# Patient Record
Sex: Male | Born: 1985
Health system: Southern US, Community
[De-identification: ages and names within clinical notes are randomized; demographics above are authoritative.]

## PROBLEM LIST (undated history)

## (undated) DIAGNOSIS — M549 Dorsalgia, unspecified: Secondary | ICD-10-CM

## (undated) HISTORY — PX: OTHER SURGICAL HISTORY: SHX169

---
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
## 2016-12-23 ENCOUNTER — Emergency Department (HOSPITAL_COMMUNITY)
Admission: EM | Admit: 2016-12-23 | Discharge: 2016-12-23 | Disposition: A | Discharge status: Home / Self Care | Payer: 59 | Attending: Emergency Medicine | Admitting: Emergency Medicine

## 2016-12-23 ENCOUNTER — Encounter (HOSPITAL_COMMUNITY): Payer: Self-pay

## 2016-12-23 ENCOUNTER — Emergency Department (HOSPITAL_COMMUNITY): Payer: 59

## 2016-12-23 DIAGNOSIS — R519 Headache, unspecified: Secondary | ICD-10-CM

## 2016-12-23 DIAGNOSIS — F17210 Nicotine dependence, cigarettes, uncomplicated: Secondary | ICD-10-CM | POA: Insufficient documentation

## 2016-12-23 DIAGNOSIS — G43001 Migraine without aura, not intractable, with status migrainosus: Secondary | ICD-10-CM

## 2016-12-23 DIAGNOSIS — R51 Headache: Secondary | ICD-10-CM | POA: Diagnosis present

## 2016-12-23 DIAGNOSIS — G43909 Migraine, unspecified, not intractable, without status migrainosus: Secondary | ICD-10-CM | POA: Insufficient documentation

## 2016-12-23 HISTORY — DX: Dorsalgia, unspecified: M54.9

## 2016-12-23 LAB — CBC
HCT: 39.5 % (ref 39.0–52.0)
Hemoglobin: 13.9 g/dL (ref 13.0–17.0)
MCH: 32.9 pg (ref 26.0–34.0)
MCHC: 35.2 g/dL (ref 30.0–36.0)
MCV: 93.4 fL (ref 78.0–100.0)
Platelets: 277 K/uL (ref 150–400)
RBC: 4.23 MIL/uL (ref 4.22–5.81)
RDW: 12.5 % (ref 11.5–15.5)
WBC: 7.5 K/uL (ref 4.0–10.5)

## 2016-12-23 LAB — I-STAT CHEM 8, ED
BUN: 16 mg/dL (ref 6–20)
Calcium, Ion: 1.2 mmol/L (ref 1.15–1.40)
Chloride: 101 mmol/L (ref 101–111)
Creatinine, Ser: 0.8 mg/dL (ref 0.61–1.24)
Glucose, Bld: 92 mg/dL (ref 65–99)
HCT: 41 % (ref 39.0–52.0)
Hemoglobin: 13.9 g/dL (ref 13.0–17.0)
Potassium: 4.3 mmol/L (ref 3.5–5.1)
Sodium: 138 mmol/L (ref 135–145)
TCO2: 27 mmol/L (ref 0–100)

## 2016-12-23 LAB — CSF CELL COUNT WITH DIFFERENTIAL
RBC COUNT CSF: 0 /mm3
RBC Count, CSF: 0 /mm3
TUBE #: 4
Tube #: 1
WBC CSF: 2 /mm3 (ref 0–5)
WBC, CSF: 2 /mm3 (ref 0–5)

## 2016-12-23 LAB — PROTEIN, CSF: Total  Protein, CSF: 35 mg/dL (ref 15–45)

## 2016-12-23 LAB — GLUCOSE, CSF: GLUCOSE CSF: 65 mg/dL (ref 40–70)

## 2016-12-23 MED ORDER — LIDOCAINE-EPINEPHRINE (PF) 2 %-1:200000 IJ SOLN
10.0000 mL | Freq: Once | INTRAMUSCULAR | Status: AC
  Administered 2016-12-23: 10 mL
  Filled 2016-12-23: qty 20

## 2016-12-23 MED ORDER — IOPAMIDOL (ISOVUE-370) INJECTION 76%
100.0000 mL | Freq: Once | INTRAVENOUS | Status: AC | PRN
Start: 2016-12-23 — End: 2016-12-23
  Administered 2016-12-23: 100 mL via INTRAVENOUS

## 2016-12-23 MED ORDER — IOPAMIDOL (ISOVUE-370) INJECTION 76%
INTRAVENOUS | Status: AC
  Filled 2016-12-23: qty 100

## 2016-12-23 NOTE — Discharge Instructions (Signed)
We saw you in the ER for headaches. All the labs and imaging are normal. We are not sure what is causing your headaches, however, there appears to be no evidence of infection, bleeds or tumors based on our exam and results.  Please take motrin round the clock for the next 6 hours, and take other meds prescribed only for break through pain. See your doctor if the pain persists.

## 2016-12-23 NOTE — ED Provider Notes (Addendum)
[facility]EMERGENCY DEPT Provider Note   CSN: [id number] Arrival date & time: 12/23/16  1219     History   Chief Complaint Chief Complaint  Patient presents with  . Migraine  . Nausea    HPI Justin Barry is a 31 y.o. male.  HPI Pt comes in with cc of severe headaches. PT has no medical hx. His headaches have actually resolved, but he was seen by Neurologist who sent him to the ER for CT scan and LP for r/o SAH. Pt reports that 10 days aho he had sudden severe headaches that lasted for 4 days, improved and then came back again. Pain was thunder calp type headaches, with photophobia and there was no other neurologic symptoms associated with them. Pt does report that his headaches was worse everytime he strained.  Currently, pt has no headaches. I called the Neurologist who sent him to the ER, and they have closed for the weekend.  Past Medical History:  Diagnosis Date  . Back pain     There are no active problems to display for this patient.   Past Surgical History:  Procedure Laterality Date  . ear cyst removal         Home Medications    Prior to Admission medications   Not on File    Family History History reviewed. No pertinent family history.  Social History Social History  Substance Use Topics  . Smoking status: Current Some Day Smoker    Types: Cigarettes  . Smokeless tobacco: Never Used  . Alcohol use Yes     Comment: occasionally     Allergies   Patient has no known allergies.   Review of Systems Review of Systems  Constitutional: Negative for activity change and appetite change.  Respiratory: Negative for cough and shortness of breath.   Cardiovascular: Negative for chest pain.  Gastrointestinal: Negative for abdominal pain and nausea.  Neurological: Positive for headaches. Negative for dizziness, seizures, speech difficulty, light-headedness and numbness.     Physical Exam Updated Vital Signs BP 135/81 (BP Location: Left Arm)   Pulse  80   Temp 98.8 F (37.1 C) (Oral)   Resp 18   Ht 6' (1.829 m)   Wt 88.5 kg (195 lb)   SpO2 97%   BMI 26.45 kg/m   Physical Exam  Constitutional: He is oriented to person, place, and time. He appears well-developed.  HENT:  Head: Atraumatic.  Neck: Neck supple.  Cardiovascular: Normal rate.   Pulmonary/Chest: Effort normal.  Neurological: He is alert and oriented to person, place, and time. No cranial nerve deficit.  Skin: Skin is warm.  Nursing note and vitals reviewed.    ED Treatments / Results  Labs (all labs ordered are listed, but only abnormal results are displayed) Labs Reviewed  CSF CULTURE  CBC  GLUCOSE, CSF  PROTEIN, CSF  CSF CELL COUNT WITH DIFFERENTIAL  CSF CELL COUNT WITH DIFFERENTIAL  I-STAT CHEM 8, ED    EKG  EKG Interpretation None       Radiology Ct Angio Head W Or Wo Contrast  Result Date: 12/23/2016 CLINICAL DATA:  Severe headache beginning 2 weeks ago. EXAM: CT ANGIOGRAPHY HEAD TECHNIQUE: Multidetector CT imaging of the head was performed using the standard protocol during bolus administration of intravenous contrast. Multiplanar CT image reconstructions and MIPs were obtained to evaluate the vascular anatomy. CONTRAST:  100 cc Isovue 370 COMPARISON:  None. FINDINGS: CT HEAD Brain: No evidence of malformation, atrophy, old or acute small or large  vessel infarction, mass lesion, hemorrhage, hydrocephalus or extra-axial collection. No evidence of pituitary lesion. Vascular: No vascular calcification.  No hyperdense vessels. Skull: Normal.  No fracture or focal bone lesion. Sinuses/Orbits: Visualized sinuses are clear. No fluid in the middle ears or mastoids. Visualized orbits are normal. Other: None significant CTA HEAD Anterior circulation: Both internal carotid arteries are widely patent through the skullbase and siphon regions. The anterior and middle cerebral vessels are normal without proximal stenosis, aneurysm or vascular malformation. Posterior  circulation: Both vertebral arteries are widely patent to the basilar. Posterior circulation branch vessels are normal. Venous sinuses: Patent and normal Anatomic variants: None significant Delayed phase: No abnormal enhancement IMPRESSION: Normal examinations. No evidence of subarachnoid hemorrhage or other abnormal finding. No cause of headache identified. CT angiography is normal. Electronically Signed   By: Paulina FusiMark  Shogry M.D.   On: 12/23/2016 15:20    Procedures .Lumbar Puncture Date/Time: 12/23/2016 4:35 PM Performed by: Derwood KaplanNANAVATI, Emmogene Simson Authorized by: Derwood KaplanNANAVATI, Chirstine Defrain   Consent:    Consent obtained:  Written   Consent given by:  Patient   Risks discussed:  Bleeding   Alternatives discussed:  Observation and delayed treatment Pre-procedure details:    Procedure purpose:  Diagnostic   Preparation: Patient was prepped and draped in usual sterile fashion   Procedure details:    Lumbar space:  L4-L5 interspace   Patient position:  Sitting   Needle gauge:  22   Needle type:  Diamond point   Ultrasound guidance: no     Number of attempts:  1   Fluid appearance:  Clear   Tubes of fluid:  4   Total volume (ml):  10 Post-procedure:    Puncture site:  Adhesive bandage applied   Patient tolerance of procedure:  Tolerated well, no immediate complications   (including critical care time)   Medications Ordered in ED Medications  iopamidol (ISOVUE-370) 76 % injection (not administered)  iopamidol (ISOVUE-370) 76 % injection 100 mL (100 mLs Intravenous Contrast Given 12/23/16 1454)  lidocaine-EPINEPHrine (XYLOCAINE W/EPI) 2 %-1:200000 (PF) injection 10 mL (10 mLs Infiltration Given by Other 12/23/16 1625)     Initial Impression / Assessment and Plan / ED Course  I have reviewed the triage vital signs and the nursing notes.  Pertinent labs & imaging results that were available during my care of the patient were reviewed by me and considered in my medical decision making (see chart for  details).     Pt with severe headaches, [facility].  CT angio ordered for the head and neg. LP done - and no xanthochromia seen. Anticipate d/c.  Final Clinical Impressions(s) / ED Diagnoses   Final diagnoses:  Severe headache  Migraine without aura and with status migrainosus, not intractable    New Prescriptions New Prescriptions   No medications on file     Derwood KaplanNanavati, Kathrynne Kulinski, MD 12/23/16 1636    Derwood KaplanNanavati, Tahjay Binion, MD 12/23/16 1740

## 2016-12-23 NOTE — ED Triage Notes (Signed)
Patient states he began having a migraine 2 weeks ago and a second one  9 days ago. Patient went to a neurolgist and was told to come to the ED to have a CT scan and a spinal tap to rule out an aneurysm. Patient denies any nausea. Patient denies any neck stiffness, blurred vision, or dizziness.

## 2016-12-27 LAB — CSF CULTURE: CULTURE: NO GROWTH

## 2016-12-27 LAB — CSF CULTURE W GRAM STAIN

## 2017-03-07 ENCOUNTER — Encounter (HOSPITAL_COMMUNITY): Payer: Self-pay | Admitting: Emergency Medicine

## 2017-03-07 ENCOUNTER — Ambulatory Visit (HOSPITAL_COMMUNITY)
Admission: EM | Admit: 2017-03-07 | Discharge: 2017-03-07 | Disposition: A | Discharge status: Home / Self Care | Payer: 59 | Attending: Family Medicine | Admitting: Family Medicine

## 2017-03-07 DIAGNOSIS — G4700 Insomnia, unspecified: Secondary | ICD-10-CM

## 2017-03-07 NOTE — Discharge Instructions (Signed)
Try taking a benadryl tonight and establish with a primary care provider.

## 2017-03-07 NOTE — ED Provider Notes (Signed)
[facility]    CSN: [id number] Arrival date & time: 03/07/17  1447     History   Chief Complaint Chief Complaint  Patient presents with  . Insomnia    HPI Justin Barry is a 31 y.o. male.   Patient is a 31 yo M who presents to urgent care with c/o insomnia. States that is this a long standing issue for him, he has had trouble sleeping since leaving [employer] 8 years ago partially due to the changing sleep cycle in the [employer]. He regularly smokes marijuana to deal with this with some good relief but feels his difficulty sleeping has been worsening over the last 2 months. He endorses some increased stress with his work and with buying a house but states he has always managed his stress well and denies ever having any manic symptoms. States his issue is with falling asleep as his mind continuously races. If he can fall asleep than usually sleep for 5-6 hours and wakes rested. States he has a very important work day tomorrow and his biggest concern is getting sleep tonight as he has had particular difficulty for the past 3 nights despite drinking "an entire bottle of wine to knock me out". He has a bedtime routine, no screens before bed, keep his bedroom cool and dark, does not nap during the day. Has tried melatonin without success. Cannot take tylenol PM as it causes sleep paralysis. Does not use any other recreational drugs other than marijuana. No primary care, but is interested in establishing in the area       Past Medical History:  Diagnosis Date  . Back pain     There are no active problems to display for this patient.   Past Surgical History:  Procedure Laterality Date  . ear cyst removal         Home Medications    Prior to Admission medications   Not on File    Family History No family history on file.  Social History Social History  Substance Use Topics  . Smoking status: Current Some Day Smoker    Types: Cigarettes  . Smokeless tobacco:  Never Used  . Alcohol use Yes     Comment: occasionally     Allergies   Patient has no known allergies.   Review of Systems Review of Systems  Eyes: Negative for visual disturbance.  Neurological: Negative for headaches.  Psychiatric/Behavioral: Positive for sleep disturbance. Negative for behavioral problems, decreased concentration, dysphoric mood, hallucinations, self-injury and suicidal ideas. The patient is nervous/anxious. The patient is not hyperactive.      Physical Exam Triage Vital Signs ED Triage Vitals  Enc Vitals Group     BP 03/07/17 1529 140/90     Pulse Rate 03/07/17 1529 79     Resp 03/07/17 1529 16     Temp 03/07/17 1529 99 F (37.2 C)     Temp Source 03/07/17 1529 Oral     SpO2 03/07/17 1529 100 %     Weight 03/07/17 1526 200 lb (90.7 kg)     Height 03/07/17 1526 6' (1.829 m)     Head Circumference --      Peak Flow --      Pain Score 03/07/17 1526 0     Pain Loc --      Pain Edu? --      Excl. in GC? --    No data found.   Updated Vital Signs BP 140/90   Pulse 79  Temp 99 F (37.2 C) (Oral)   Resp 16   Ht 6' (1.829 m)   Wt 200 lb (90.7 kg)   SpO2 100%   BMI 27.12 kg/m   Visual Acuity Right Eye Distance:   Left Eye Distance:   Bilateral Distance:    Right Eye Near:   Left Eye Near:    Bilateral Near:     Physical Exam  Constitutional: He is oriented to person, place, and time. He appears well-developed and well-nourished. No distress.  HENT:  Head: Normocephalic and atraumatic.  Nose: Nose normal.  Eyes: Conjunctivae and EOM are normal.  Musculoskeletal: Normal range of motion.  Neurological: He is alert and oriented to person, place, and time.  Skin: Skin is warm and dry. Capillary refill takes less than 2 seconds.  Psychiatric: He has a normal mood and affect.     UC Treatments / Results  Labs (all labs ordered are listed, but only abnormal results are displayed) Labs Reviewed - No data to display  EKG  EKG  Interpretation None       Radiology No results found.  Procedures Procedures (including critical care time)  Medications Ordered in UC Medications - No data to display   Initial Impression / Assessment and Plan / UC Course  I have reviewed the triage vital signs and the nursing notes.  Pertinent labs & imaging results that were available during my care of the patient were reviewed by me and considered in my medical decision making (see chart for details).   Patient is a 31 yo M with chronic insomnia who presents with acutely worsening difficulty initiating sleep over the past 2 months and not being able  to sleep for the past 3 nights. Given his symptoms of mind racing and increased stress suspect some underlying anxiety may be playing a role. Counseled patient on marijuana and alcohol use. Advised that patient establish with a primary care provider for further assessment and suggested that he may also find counseling to be beneficial for the long term. For tonight, recommended that patient try OTC benadryl as he has had good success with that in the past.  Final Clinical Impressions(s) / UC Diagnoses   Final diagnoses:  Insomnia, unspecified type    New Prescriptions There are no discharge medications for this patient.     Leland Her, DO 03/07/17 1640

## 2017-03-07 NOTE — ED Triage Notes (Signed)
PT reports he hasn't slept in three nights. PT reports he has had issues with insomnia since leaving the [employer] several years ago. Cannabis has been effective up until the last three months. PT usually has isolated sleepless nights, but never three in a row. PT reports he does not take sleep aids due to sleep paralysis.

## 2019-05-16 ENCOUNTER — Emergency Department (HOSPITAL_COMMUNITY)
Admission: EM | Admit: 2019-05-16 | Discharge: 2019-05-17 | Disposition: A | Discharge status: Home / Self Care | Payer: 59 | Attending: Emergency Medicine | Admitting: Emergency Medicine

## 2019-05-16 ENCOUNTER — Other Ambulatory Visit: Payer: Self-pay

## 2019-05-16 ENCOUNTER — Encounter (HOSPITAL_COMMUNITY): Payer: Self-pay | Admitting: Emergency Medicine

## 2019-05-16 DIAGNOSIS — F1910 Other psychoactive substance abuse, uncomplicated: Secondary | ICD-10-CM | POA: Insufficient documentation

## 2019-05-16 DIAGNOSIS — F10920 Alcohol use, unspecified with intoxication, uncomplicated: Secondary | ICD-10-CM | POA: Diagnosis not present

## 2019-05-16 DIAGNOSIS — F5104 Psychophysiologic insomnia: Secondary | ICD-10-CM | POA: Diagnosis not present

## 2019-05-16 DIAGNOSIS — R7401 Elevation of levels of liver transaminase levels: Secondary | ICD-10-CM | POA: Diagnosis not present

## 2019-05-16 DIAGNOSIS — Y908 Blood alcohol level of 240 mg/100 ml or more: Secondary | ICD-10-CM | POA: Diagnosis not present

## 2019-05-16 DIAGNOSIS — Z720 Tobacco use: Secondary | ICD-10-CM | POA: Insufficient documentation

## 2019-05-16 DIAGNOSIS — F411 Generalized anxiety disorder: Secondary | ICD-10-CM | POA: Diagnosis not present

## 2019-05-16 LAB — RAPID URINE DRUG SCREEN, HOSP PERFORMED
Amphetamines: NOT DETECTED
Barbiturates: NOT DETECTED
Benzodiazepines: POSITIVE — AB
Cocaine: NOT DETECTED
Opiates: NOT DETECTED
Tetrahydrocannabinol: POSITIVE — AB

## 2019-05-16 LAB — COMPREHENSIVE METABOLIC PANEL
ALT: 261 U/L — ABNORMAL HIGH (ref 0–44)
AST: 305 U/L — ABNORMAL HIGH (ref 15–41)
Albumin: 3.6 g/dL (ref 3.5–5.0)
Alkaline Phosphatase: 87 U/L (ref 38–126)
Anion gap: 14 (ref 5–15)
BUN: 10 mg/dL (ref 6–20)
CO2: 26 mmol/L (ref 22–32)
Calcium: 8.5 mg/dL — ABNORMAL LOW (ref 8.9–10.3)
Chloride: 102 mmol/L (ref 98–111)
Creatinine, Ser: 0.93 mg/dL (ref 0.61–1.24)
GFR calc Af Amer: >60 mL/min (ref >60)
GFR calc non Af Amer: >60 mL/min (ref >60)
Glucose, Bld: 126 mg/dL — ABNORMAL HIGH (ref 70–99)
Potassium: 4.1 mmol/L (ref 3.5–5.1)
Sodium: 142 mmol/L (ref 135–145)
Total Bilirubin: 1.1 mg/dL (ref 0.3–1.2)
Total Protein: 7.2 g/dL (ref 6.5–8.1)

## 2019-05-16 LAB — CBC
HCT: 42.3 % (ref 39.0–52.0)
Hemoglobin: 14.6 g/dL (ref 13.0–17.0)
MCH: 33.8 pg (ref 26.0–34.0)
MCHC: 34.5 g/dL (ref 30.0–36.0)
MCV: 97.9 fL (ref 80.0–100.0)
Platelets: 284 10*3/uL (ref 150–400)
RBC: 4.32 MIL/uL (ref 4.22–5.81)
RDW: 13.6 % (ref 11.5–15.5)
WBC: 5.9 10*3/uL (ref 4.0–10.5)
nRBC: 0 % (ref 0.0–0.2)

## 2019-05-16 MED ORDER — THIAMINE HCL 100 MG/ML IJ SOLN: 100.0000 mg | Freq: Every day | INTRAMUSCULAR | Status: DC

## 2019-05-16 MED ORDER — DOXYLAMINE SUCCINATE (SLEEP) 25 MG PO TABS
25.0000 mg | ORAL_TABLET | Freq: Every evening | ORAL | Status: DC | PRN
Start: 2019-05-16 — End: 2019-05-17
  Administered 2019-05-17: 02:00:00 25 mg via ORAL
  Filled 2019-05-16: qty 1

## 2019-05-16 MED ORDER — THIAMINE HCL 100 MG PO TABS: 100.0000 mg | ORAL_TABLET | Freq: Every day | ORAL | Status: DC

## 2019-05-16 NOTE — ED Provider Notes (Signed)
[facility] EMERGENCY DEPARTMENT Provider Note   CSN: [id number] Arrival date & time: 05/16/19  2229     History Chief Complaint  Patient presents with  . Alcohol Problem    Justin Barry is a 33 y.o. male.   33 year old male presents to the emergency department for psychiatric evaluation for assistance with his insomnia.  He has been experiencing insomnia for over 10 years, but states that it has been worse over the past 4 to 5 months.  He did see somebody a few days ago who prescribed a daily medication as well as 2 tablets of PRN Ativan.  Patient has been using this without relief.  Drinks up to 1/5 of liquor daily to try and help him fall asleep.  Reporting auditory hallucinations in triage.  No c/o SI/HI.  Was dropped off at the ED by his parents.  Last saw a psychiatrist for insomnia and social anxiety 2 years ago.  Reports that what he was given for insomnia at that time caused "sleep paralysis" and he stopped taking it - believes the name of the medication "started with an 'R'."  The history is provided by the patient. No language interpreter was used.  Alcohol Problem       Past Medical History:  Diagnosis Date  . Back pain     There are no problems to display for this patient.   Past Surgical History:  Procedure Laterality Date  . ear cyst removal         No family history on file.  Social History   Tobacco Use  . Smoking status: Current Some Day Smoker    Types: Cigarettes  . Smokeless tobacco: Never Used  Substance Use Topics  . Alcohol use: Yes    Comment: occasionally  . Drug use: Yes    Types: Marijuana    Comment: 2-3 times a week.    Home Medications Prior to Admission medications   Not on File    Allergies    Patient has no known allergies.  Review of Systems   Review of Systems  Ten systems reviewed and are negative for acute change, except as noted in the HPI.    Physical Exam Updated Vital Signs BP 112/69  (BP Location: Right Arm)   Pulse (!) 108   Temp 98.1 F (36.7 C) (Oral)   Resp 18   SpO2 98%   Physical Exam Vitals and nursing note reviewed.  Constitutional:      General: He is not in acute distress.    Appearance: He is well-developed. He is not diaphoretic.     Comments: Nontoxic appearing and in NAD  HENT:     Head: Normocephalic and atraumatic.  Eyes:     General: No scleral icterus.    Conjunctiva/sclera: Conjunctivae normal.  Pulmonary:     Effort: Pulmonary effort is normal. No respiratory distress.     Comments: Respirations even and unlabored Musculoskeletal:        General: Normal range of motion.     Cervical back: Normal range of motion.  Skin:    General: Skin is warm and dry.     Coloration: Skin is not pale.     Findings: No erythema or rash.  Neurological:     Mental Status: He is alert and oriented to person, place, and time.  Psychiatric:        Speech: Speech normal.        Behavior: Behavior is uncooperative and agitated.  Comments: Intermittently uncooperative and agitated, but directable. No SI/HI.     ED Results / Procedures / Treatments   Labs (all labs ordered are listed, but only abnormal results are displayed) Labs Reviewed  COMPREHENSIVE METABOLIC PANEL - Abnormal; Notable for the following components:      Result Value   Glucose, Bld 126 (*)    Calcium 8.5 (*)    AST 305 (*)    ALT 261 (*)    All other components within normal limits  ETHANOL - Abnormal; Notable for the following components:   Alcohol, Ethyl (B) 473 (*)    All other components within normal limits  RAPID URINE DRUG SCREEN, HOSP PERFORMED - Abnormal; Notable for the following components:   Benzodiazepines POSITIVE (*)    Tetrahydrocannabinol POSITIVE (*)    All other components within normal limits  RESPIRATORY PANEL BY RT PCR (FLU A&B, COVID)  CBC    EKG None  Radiology No results found.  Procedures Procedures (including critical care  time)  Medications Ordered in ED Medications  thiamine tablet 100 mg (has no administration in time range)    Or  thiamine (B-1) injection 100 mg (has no administration in time range)  doxylamine (Sleep) (UNISOM) tablet 25 mg (25 mg Oral Given 05/17/19 0224)  diphenhydrAMINE (BENADRYL) injection 25 mg (25 mg Intramuscular Given 05/17/19 0224)  Melatonin TABS 3 mg (3 mg Oral Given 05/17/19 0227)    ED Course  I have reviewed the triage vital signs and the nursing notes.  Pertinent labs & imaging results that were available during my care of the patient were reviewed by me and considered in my medical decision making (see chart for details).    MDM Rules/Calculators/A&P                       33 year old male presents to the emergency department for evaluation of chronic insomnia. This has been an ongoing issue over the past 10 years, though he reports worsening insomnia over the past few months.  Denies any suicidal or homicidal thoughts.  Does report heavy alcohol consumption to try and assist with sleep.  This is likely the cause of his transaminitis.  Noted to be positive for marijuana and benzodiazepines on UDS.  His ethanol level is 476.  The patient has been medically cleared and evaluated by TTS.  TTS does not feel the patient meets inpatient criteria at this time; recommend follow-up with outpatient psychiatry.  Will give resource guide and return precautions.  Kept in the ED overnight for sobering.  Presently feel he can be discharged with a sober ride.  RN notified to contact parents for patient pick up.   Final Clinical Impression(s) / ED Diagnoses Final diagnoses:  Alcoholic intoxication without complication (HCC)  Chronic insomnia  Transaminitis    Rx / DC Orders ED Discharge Orders    None       Antony Madura, PA-C 05/17/19 3354    Ward, Layla Maw, DO 05/17/19 [phone]

## 2019-05-16 NOTE — ED Triage Notes (Signed)
Patient requesting detox for his alcoholism , reports drinking daily with insomnia and auditory hallucinations , denies suicidal ideations .

## 2019-05-17 LAB — ETHANOL: Alcohol, Ethyl (B): 473 mg/dL (ref <10)

## 2019-05-17 MED ORDER — LORAZEPAM 1 MG PO TABS
1.0000 mg | ORAL_TABLET | Freq: Once | ORAL | Status: AC
  Administered 2019-05-17: 1 mg via ORAL
  Filled 2019-05-17: qty 1

## 2019-05-17 MED ORDER — DIPHENHYDRAMINE HCL 50 MG/ML IJ SOLN
25.0000 mg | Freq: Once | INTRAMUSCULAR | Status: AC
  Administered 2019-05-17: 02:00:00 25 mg via INTRAMUSCULAR
  Filled 2019-05-17: qty 1

## 2019-05-17 MED ORDER — MELATONIN 3 MG PO TABS
3.0000 mg | ORAL_TABLET | Freq: Once | ORAL | Status: AC
  Administered 2019-05-17: 3 mg via ORAL
  Filled 2019-05-17: qty 1
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
## 2019-05-17 NOTE — ED Notes (Signed)
Patient states he has been binge drinking to sleep for 6 months; Patient states he lied to TTS counselor about amount of alcohol consumption; Patient states he is going through withdrawals; pt has been up and down to the bathroom and continues to be restless-Monique,RN

## 2019-05-17 NOTE — [facility] Counselor (Signed)
Clinician faxed OPT resources to pt's RN.   Vertell Novak, [place], [facility], [facility] Triage Specialist [phone]

## 2019-05-17 NOTE — ED Notes (Signed)
At pt's request this RN reviewed dc instructions with his mother.

## 2019-05-17 NOTE — ED Notes (Signed)
This RN spoke with pt's mother, J. Brewer, by phone [phone]) to update on pt condition.  Ms Doug Sou asked repeatedly that pt be kept for med detox and if not then transferred to an ETOH treatment program.  This RN explained that pt/family may need to look into these programs for pt, and that a dx home does not mean pt doesn't need help, only that pt doesn't need help in an emergency setting.  Ms Doug Sou verbalized understanding, requested resources. This RN made MD aware.

## 2019-05-17 NOTE — Discharge Instructions (Signed)
We recommend follow-up with a psychiatrist for management of your ongoing insomnia.  Your liver tests were slightly elevated which is suspected to be due to your continued heavy drinking.  These should be trended by a primary care doctor.  You may return for any new or concerning symptoms.

## 2019-07-16 IMAGING — CT CT ANGIO HEAD
3 of 13 series · 15 of 47 positions shown · IV contrast (isovue)
Comparison: None.

CLINICAL DATA: Severe headache beginning 2 weeks ago.

EXAM:
CT ANGIOGRAPHY HEAD
TECHNIQUE: Multidetector CT imaging of the head was performed using the
standard protocol during bolus administration of intravenous
contrast. Multiplanar CT image reconstructions and MIPs were
obtained to evaluate the vascular anatomy.
CONTRAST:  100 cc Isovue 370

[Series 9: axial · axial · 0.41mm/px · z∈[-144,-18]mm · 10 of 155 slices shown]
[im 15/155  brain]
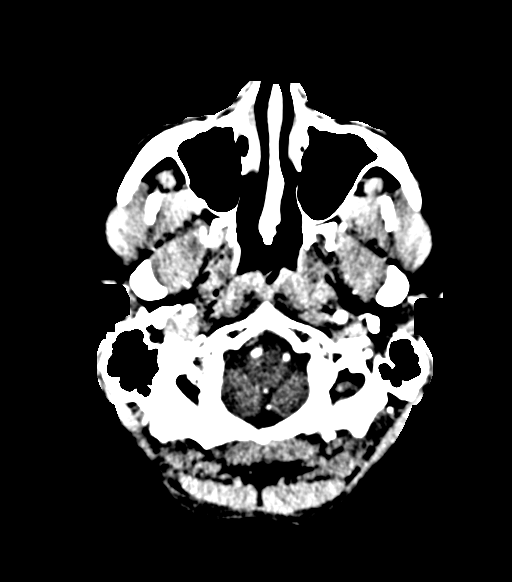
[im 29/155  bone]
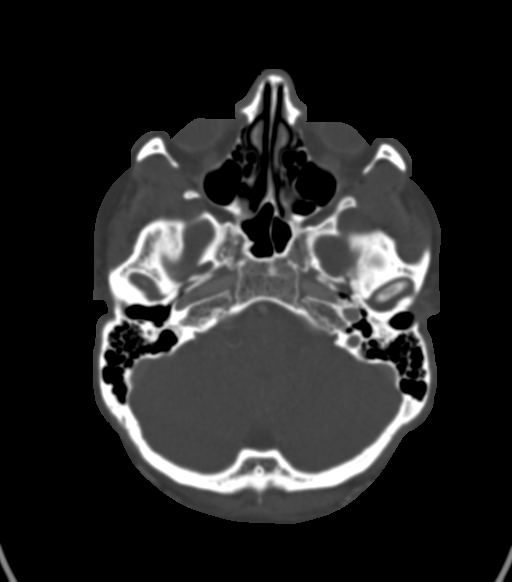
[im 43/155  brain]
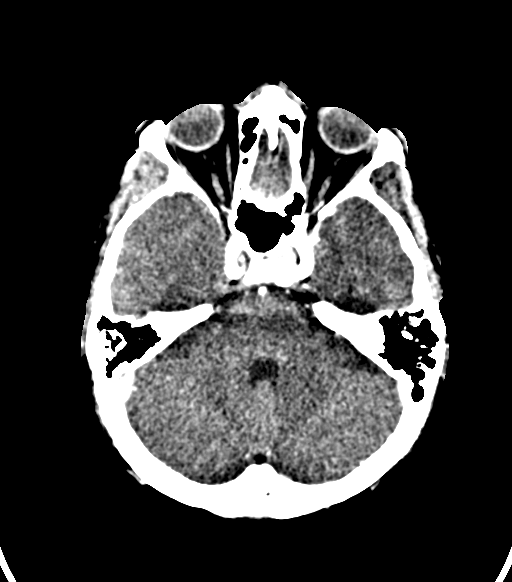
[im 57/155  bone]
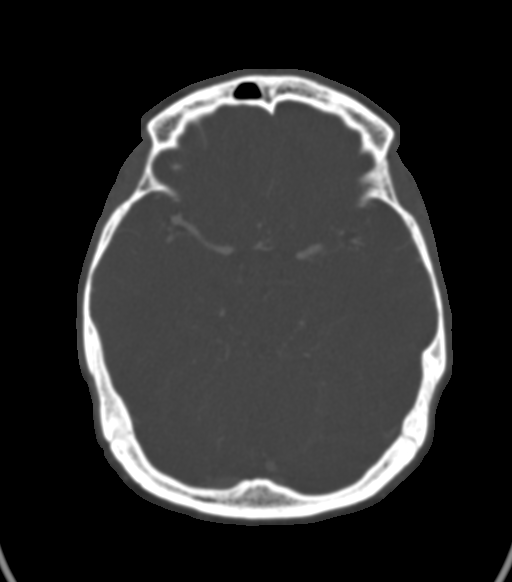
[im 71/155  brain]
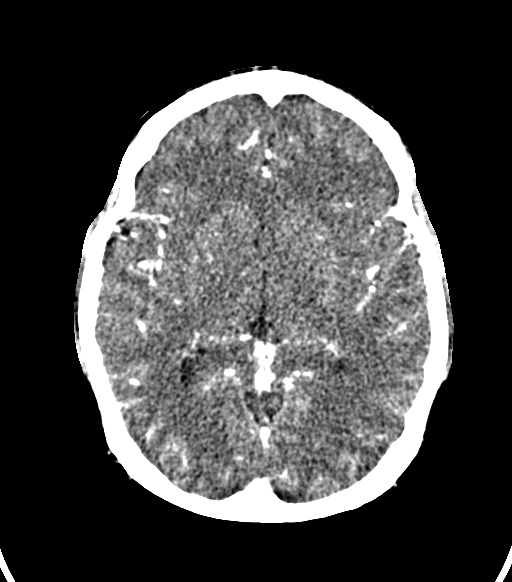
[im 85/155  bone]
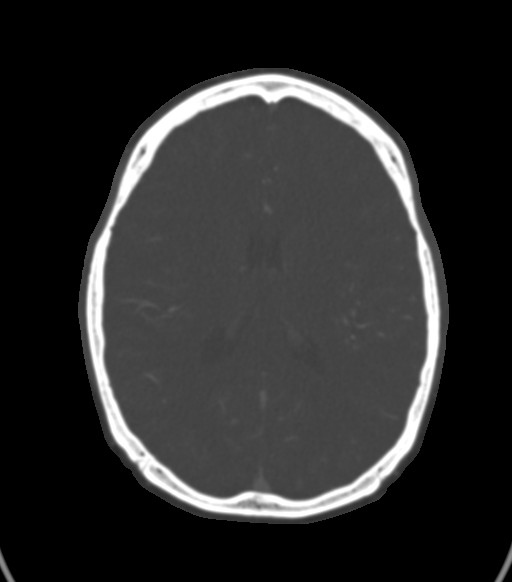
[im 99/155  brain]
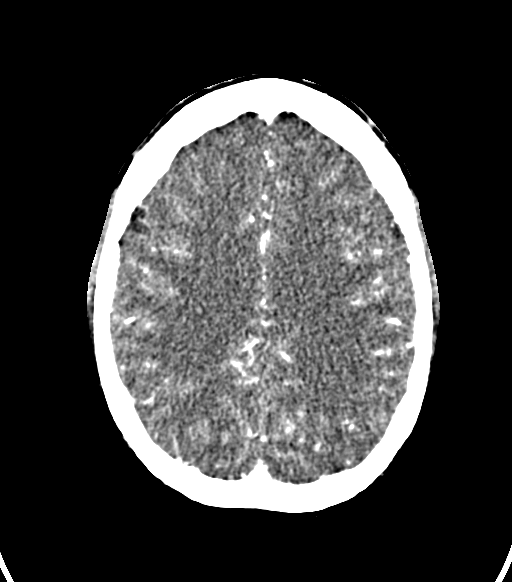
[im 113/155  bone]
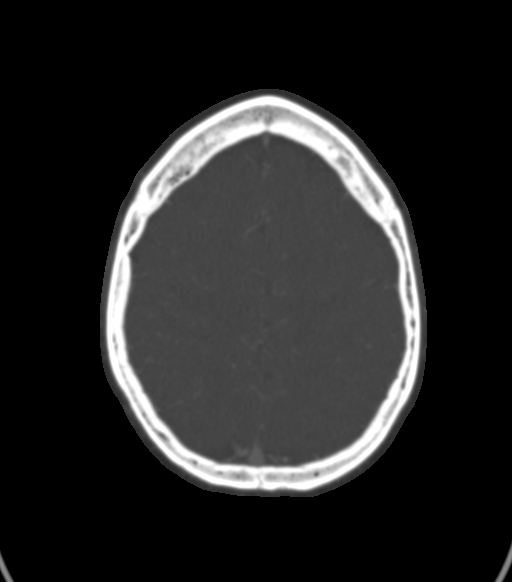
[im 127/155  brain]
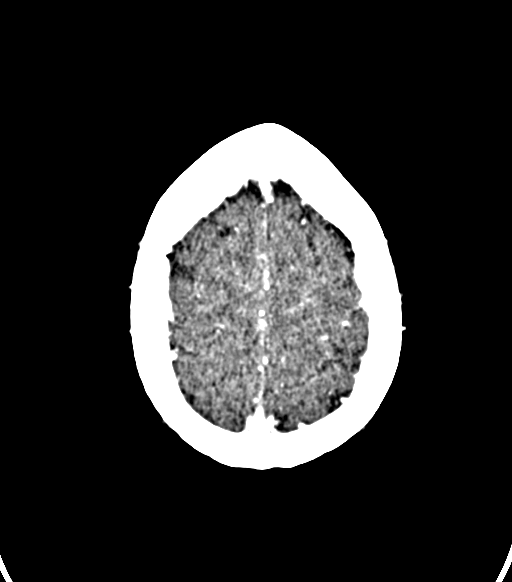
[im 141/155  bone]
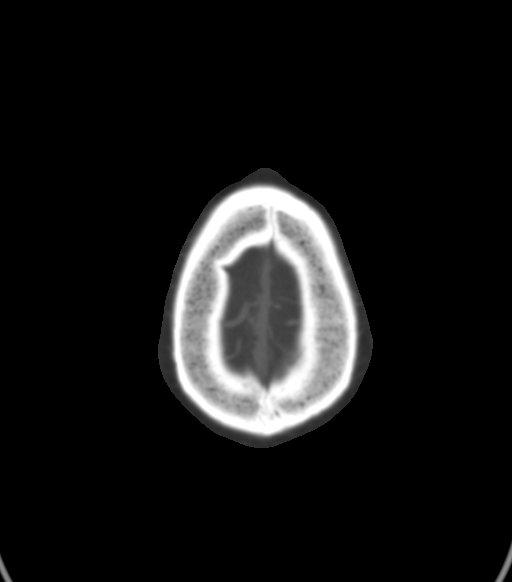

[Series 10: coronal · coronal · 0.34mm/px · 3 of 209 slices shown]
[im 70/209  brain]
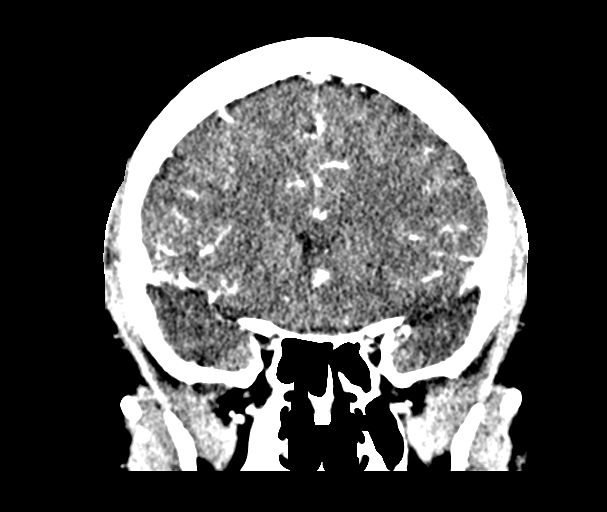
[im 105/209  brain]
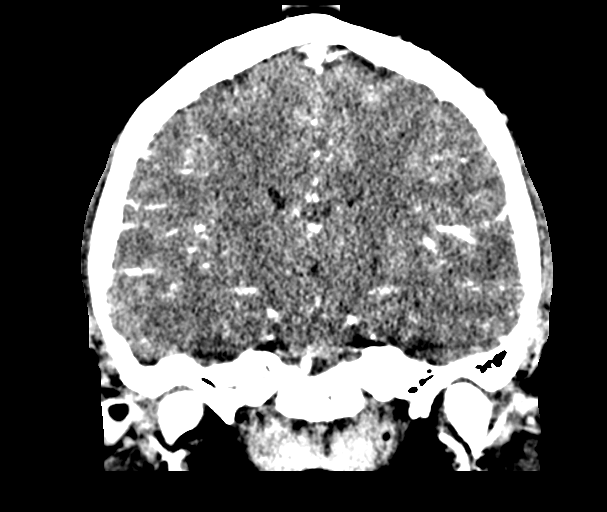
[im 139/209  brain]
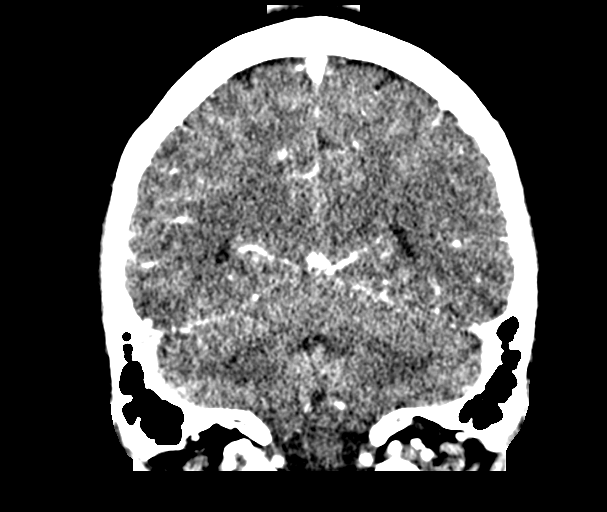

[Series 11: sagittal · sagittal · 0.36mm/px · 2 of 169 slices shown]
[im 57/169  brain]
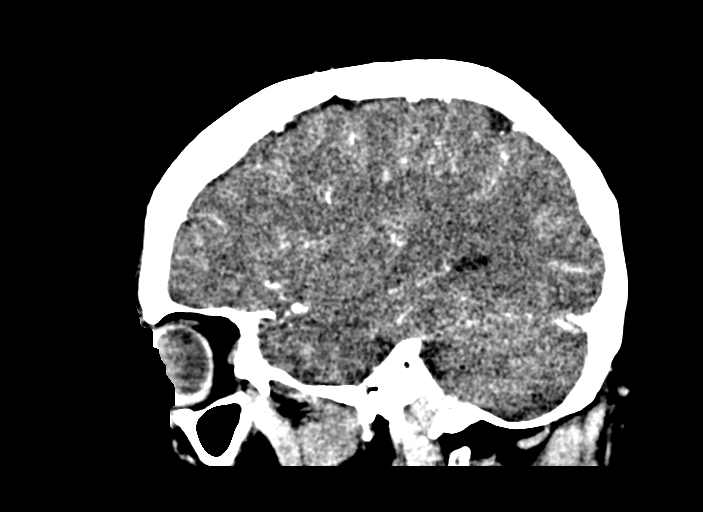
[im 113/169  brain]
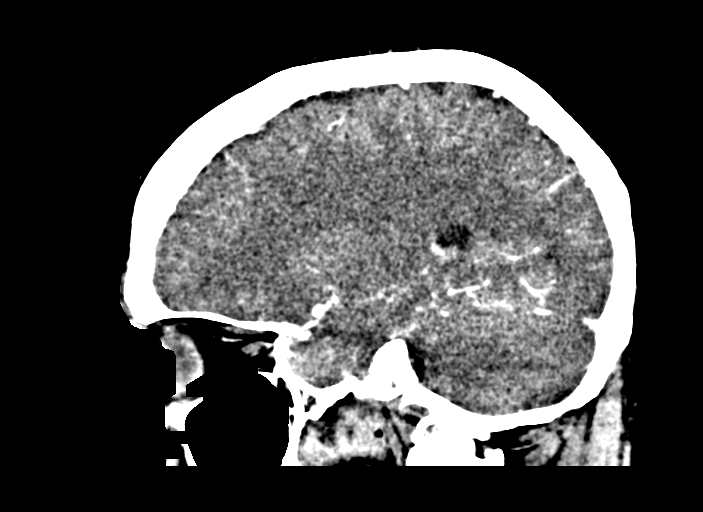

[15 of 47 positions shown; findings below may reference images not displayed]

FINDINGS: CT HEAD

Brain: No evidence of malformation, atrophy, old or acute small or
large vessel infarction, mass lesion, hemorrhage, hydrocephalus or
extra-axial collection. No evidence of pituitary lesion.

Vascular: No vascular calcification.  No hyperdense vessels.

Skull: Normal.  No fracture or focal bone lesion.

Sinuses/Orbits: Visualized sinuses are clear. No fluid in the middle
ears or mastoids. Visualized orbits are normal.

Other: None significant

CTA HEAD

Anterior circulation: Both internal carotid arteries are widely
patent through the skullbase and siphon regions. The anterior and
middle cerebral vessels are normal without proximal stenosis,
aneurysm or vascular malformation.

Posterior circulation: Both vertebral arteries are widely patent to
the basilar. Posterior circulation branch vessels are normal.

Venous sinuses: Patent and normal

Anatomic variants: None significant

Delayed phase: No abnormal enhancement
IMPRESSION: Normal examinations. No evidence of subarachnoid hemorrhage or other
abnormal finding. No cause of headache identified. CT angiography is
normal.

## 2019-11-18 ENCOUNTER — Ambulatory Visit (INDEPENDENT_AMBULATORY_CARE_PROVIDER_SITE_OTHER): Payer: 59

## 2019-11-18 ENCOUNTER — Ambulatory Visit (INDEPENDENT_AMBULATORY_CARE_PROVIDER_SITE_OTHER): Payer: 59 | Admitting: Podiatry

## 2019-11-18 ENCOUNTER — Other Ambulatory Visit: Payer: Self-pay

## 2019-11-18 DIAGNOSIS — M7989 Other specified soft tissue disorders: Secondary | ICD-10-CM | POA: Diagnosis not present

## 2019-11-18 DIAGNOSIS — M792 Neuralgia and neuritis, unspecified: Secondary | ICD-10-CM | POA: Diagnosis not present

## 2019-11-18 DIAGNOSIS — S99921A Unspecified injury of right foot, initial encounter: Secondary | ICD-10-CM

## 2019-11-18 DIAGNOSIS — S9031XA Contusion of right foot, initial encounter: Secondary | ICD-10-CM | POA: Diagnosis not present

## 2019-11-18 MED ORDER — MELOXICAM 15 MG PO TABS: 15.0000 mg | ORAL_TABLET | Freq: Every day | ORAL | 0 refills | Status: AC

## 2019-11-18 NOTE — Progress Notes (Signed)
Subjective:   Patient ID: Justin Barry, male   DOB: 34 y.o.   MRN: [id number]   HPI 34 year old male presents the office today for concerns of discomfort to the dorsal lateral aspect of the right foot.  He states that he had an injury when he fell going up a step hitting his foot on the side of opposed.  Did happen several months ago he did have a boot and only work for 6 weeks.  He states overall he is 90% better but still has some swelling as well as some nerve pain in the area.  He is able to walk his regular activities without any significant discomfort.  He was measured as no fracture or other pathology going on.   Review of Systems  All other systems reviewed and are negative.   Past Medical History:  Diagnosis Date  . Back pain     Past Surgical History:  Procedure Laterality Date  . ear cyst removal      No current outpatient medications on file.  No Known Allergies       Objective:  Physical Exam  General: AAO x3, NAD  Dermatological: Skin is warm, dry and supple bilateral. Nails x 10 are well manicured; remaining integument appears unremarkable at this time. There are no open sores, no preulcerative lesions, no rash or signs of infection present.  Vascular: Dorsalis Pedis artery and Posterior Tibial artery pedal pulses are 2/4 bilateral with immedate capillary fill time. Pedal hair growth present. No varicosities and no lower extremity edema present bilateral. There is no pain with calf compression, swelling, warmth, erythema.   Neruologic: Grossly intact via light touch bilateral.   Musculoskeletal: There is mild tenderness on the dorsal lateral aspect of the right foot.  There is mild edema to this area there is no erythema or warmth.  Negative Tinel sign along the superficial peroneal nerve.  There is some localized numbness in this area.  Slight tenderness to palpation.  Flexor, extensor tendons appear to be intact.  Muscular strength 5/5 in all groups tested  bilateral.  Gait: Unassisted, Nonantalgic.       Assessment:   34 year old male right foot contusion, neuritis    Plan:  -Treatment options discussed including all alternatives, risks, and complications -Etiology of symptoms were discussed -X-rays were obtained and reviewed with the patient.  There is no definitive evidence of acute fracture or stress fracture identified today. -Overall he states he is 90% better.  I prescribed meloxicam to take as needed discussed ice to the area as well as wearing supportive shoes.  He has a significant flatfoot which I think is contributed to the symptoms in this area as well I think that wearing better support will help take pressure off of this.  Return if symptoms worsen or fail to improve.  Vivi Barrack DPM
